# Patient Record
Sex: Female | Born: 1985 | Race: Black or African American | Hispanic: No | Marital: Married | Smoking: Current every day smoker
Health system: Southern US, Community
[De-identification: ages and names within clinical notes are randomized; demographics above are authoritative.]

---

## 2021-10-13 ENCOUNTER — Other Ambulatory Visit: Payer: Self-pay

## 2021-10-13 ENCOUNTER — Encounter: Payer: Self-pay | Admitting: *Deleted

## 2021-10-13 DIAGNOSIS — H532 Diplopia: Secondary | ICD-10-CM | POA: Insufficient documentation

## 2021-10-13 DIAGNOSIS — R519 Headache, unspecified: Secondary | ICD-10-CM | POA: Diagnosis not present

## 2021-10-13 DIAGNOSIS — F17210 Nicotine dependence, cigarettes, uncomplicated: Secondary | ICD-10-CM | POA: Insufficient documentation

## 2021-10-13 DIAGNOSIS — Z20822 Contact with and (suspected) exposure to covid-19: Secondary | ICD-10-CM | POA: Insufficient documentation

## 2021-10-13 NOTE — ED Triage Notes (Signed)
Pt brought in via ems from home with a headache.  Reports pain behind right eye.  Sx for 1 week. No relief with otc meds  pt alert  speech clear.

## 2021-10-14 ENCOUNTER — Emergency Department
Admission: EM | Admit: 2021-10-14 | Discharge: 2021-10-14 | Disposition: A | Discharge status: Home / Self Care | Payer: BC Managed Care – PPO | Attending: Emergency Medicine | Admitting: Emergency Medicine

## 2021-10-14 ENCOUNTER — Emergency Department: Payer: BC Managed Care – PPO

## 2021-10-14 DIAGNOSIS — R519 Headache, unspecified: Secondary | ICD-10-CM

## 2021-10-14 LAB — RESP PANEL BY RT-PCR (FLU A&B, COVID) ARPGX2
Influenza A by PCR: NEGATIVE
Influenza B by PCR: NEGATIVE
SARS Coronavirus 2 by RT PCR: NEGATIVE

## 2021-10-14 MED ORDER — METOCLOPRAMIDE HCL 5 MG/ML IJ SOLN
10.0000 mg | Freq: Once | INTRAMUSCULAR | Status: AC
  Administered 2021-10-14: 01:00:00 10 mg via INTRAVENOUS
  Filled 2021-10-14: qty 2

## 2021-10-14 MED ORDER — IBUPROFEN 400 MG PO TABS: 400.0000 mg | ORAL_TABLET | Freq: Four times a day (QID) | ORAL | 0 refills | Status: AC | PRN

## 2021-10-14 MED ORDER — KETOROLAC TROMETHAMINE 15 MG/ML IJ SOLN
15.0000 mg | Freq: Once | INTRAMUSCULAR | Status: AC
  Administered 2021-10-14: 01:00:00 15 mg via INTRAVENOUS
  Filled 2021-10-14: qty 1

## 2021-10-14 MED ORDER — SODIUM CHLORIDE 0.9 % IV BOLUS
1000.0000 mL | Freq: Once | INTRAVENOUS | Status: AC
  Administered 2021-10-14: 01:00:00 1000 mL via INTRAVENOUS

## 2021-10-14 MED ORDER — METOCLOPRAMIDE HCL 10 MG PO TABS: 10.0000 mg | ORAL_TABLET | Freq: Three times a day (TID) | ORAL | 0 refills | Status: AC

## 2021-10-14 NOTE — ED Notes (Signed)
Pt in lobby waiting for husband to pick her up.

## 2021-10-14 NOTE — ED Provider Notes (Signed)
[facility]  ____________________________________________   Event Date/Time   First MD Initiated Contact with Patient 10/14/21 0006     (approximate)  I have reviewed the triage vital signs and the nursing notes.   HISTORY  Chief Complaint Headache    HPI Glenda Lindsey is a 35 y.o. female with no significant past medical history who presents with headache.  Patient notes that the headaches have been going on for about 1 week.  Pain is on the right side of her face described as a throbbing sensation.  Has been intermittent, usually worse when she lies down at night.  There is no associated nausea vomiting.  Does have photophobia.  She notes that when it first started she felt like her eyes were watering and then today had an episode of double vision and left eye watering.  Also feels like she is having difficulty focusing her eyes.  She denies associated neck pain.  Has not had fevers or chills.  Has had some body aches and COVID exposure.  She denies history of similar headaches or migraine history.  Denies numbness or weakness or difficulty speaking.  Not on any blood thinners.  Tonight while she was lying on the couch she had an episode where she felt like she was not able to control her body.  She was awake and her eyes were open but felt like she could not move her upper or lower extremities.  This is what prompted her to call EMS.         No past medical history on file.  There are no problems to display for this patient.    Prior to Admission medications   Medication Sig Start Date End Date Taking? Authorizing Provider  ibuprofen (ADVIL) 400 MG tablet Take 1 tablet (400 mg total) by mouth every 6 (six) hours as needed. 10/14/21  Yes Rada Hay, MD  metoCLOPramide (REGLAN) 10 MG tablet Take 1 tablet (10 mg total) by mouth 3 (three) times daily with meals for 10 days. 10/14/21 10/24/21 Yes Rada Hay, MD    Allergies Patient has no  known allergies.  No family history on file.  Social History Social History   Tobacco Use   Smoking status: Every Day    Types: Cigarettes  Substance Use Topics   Alcohol use: Not Currently   Drug use: Not Currently    Review of Systems   Review of Systems  Constitutional:  Negative for chills and fever.  HENT:  Negative for congestion.   Eyes:  Positive for photophobia and visual disturbance.  Cardiovascular:  Negative for chest pain.  Gastrointestinal:  Negative for nausea and vomiting.  Neurological:  Positive for headaches. Negative for weakness and numbness.  All other systems reviewed and are negative.  Physical Exam Updated Vital Signs BP (!) 142/89    Pulse 87    Temp 99 F (37.2 C) (Oral)    Resp 19    Ht 5' (1.524 m)    Wt 97.5 kg    LMP 10/06/2021 (Approximate)    SpO2 100%    BMI 41.99 kg/m   Physical Exam Vitals and nursing note reviewed.  Constitutional:      General: She is not in acute distress.    Appearance: Normal appearance.     Comments: Patient prefers to keep her eyes closed during the exam  HENT:     Head: Normocephalic and atraumatic.  Eyes:     General: No scleral icterus.  Conjunctiva/sclera: Conjunctivae normal.  Pulmonary:     Effort: Pulmonary effort is normal. No respiratory distress.     Breath sounds: No stridor.  Musculoskeletal:        General: No deformity or signs of injury.     Cervical back: Normal range of motion.  Skin:    General: Skin is dry.     Coloration: Skin is not jaundiced or pale.  Neurological:     General: No focal deficit present.     Mental Status: She is alert and oriented to person, place, and time. Mental status is at baseline.     Comments: Aox3, nml speech  PERRL, EOMI, face symmetric, nml tongue movement  5/5 strength in the BL upper and lower extremities  Sensation grossly intact in the BL upper and lower extremities  Finger-nose-finger intact BL   Psychiatric:        Mood and Affect: Mood  normal.        Behavior: Behavior normal.     LABS (all labs ordered are listed, but only abnormal results are displayed)  Labs Reviewed  RESP PANEL BY RT-PCR (FLU A&B, COVID) [id number]  POC URINE PREG, ED   ____________________________________________  EKG  N/a ____________________________________________  RADIOLOGY Ky Barban, personally viewed and evaluated these images (plain radiographs) as part of my medical decision making, as well as reviewing the written report by the radiologist.  ED MD interpretation:  I reviewed the CT scan of the brain which does not show any acute intracranial process      ____________________________________________   PROCEDURES  Procedure(s) performed (including Critical Care):  Procedures   ____________________________________________   INITIAL IMPRESSION / ASSESSMENT AND PLAN / ED COURSE     The patient is a 35 year old female presents with 1 week of headache.  It is unilateral behind the right eye with some associated diplopia that is intermittent.  No history of similar headaches.  She has no other neurologic symptoms no infectious symptoms other than some body aches.  On exam she appears uncomfortable and prefers to keep her eyes closed due to photophobia.  Her neurologic exam however is intact, normal extraocular movements no dysmetria and full strength.  She has no meningismus.  Exam seems most consistent with migraine headache.  Given this is her first time with headache and has been going on for a week we will get a CT head to rule out any obvious intracranial pathology.  Will treat supportively with fluids Toradol and Reglan.  We will also make sure she is not pregnant.  I have low suspicion for subarachnoid hemorrhage given the timeframe.  Also considered cerebral venous sinus thrombosis however she has no objective findings on neurologic exam.    CT of the head does not show any acute abnormality.  Patient feeling  improved after Toradol and Reglan.  We discussed neurology follow-up and return to emergency department if headache is worsening or she develops any new neurologic symptoms.  Discharged with Motrin and Reglan. Clinical Course as of 10/14/21 0222  Wed Oct 14, 2021  0058  IMPRESSION: Normal head CT.    [KM]    Clinical Course User Index [KM] Georga Hacking, MD     ____________________________________________   FINAL CLINICAL IMPRESSION(S) / ED DIAGNOSES  Final diagnoses:  Nonintractable headache, unspecified chronicity pattern, unspecified headache type     ED Discharge Orders          Ordered    ibuprofen (ADVIL) 400 MG tablet  Every 6 hours PRN        10/14/21 0221    metoCLOPramide (REGLAN) 10 MG tablet  3 times daily with meals        10/14/21 0221             Note:  This document was prepared using Dragon voice recognition software and may include unintentional dictation errors.    Rada Hay, MD 10/14/21 [phone]

## 2021-10-14 NOTE — Discharge Instructions (Signed)
Your headache may be a migraine.  You can take ibuprofen and Reglan to help with your discomfort.  If you develop any new neurologic symptoms including numbness or weakness or your headache is worsening, please return to the emergency department.  Please follow-up with a neurologist and your primary care provider.

## 2022-10-24 ENCOUNTER — Emergency Department: Payer: BC Managed Care – PPO

## 2022-10-24 ENCOUNTER — Emergency Department
Admission: EM | Admit: 2022-10-24 | Discharge: 2022-10-24 | Discharge status: Against Medical Advice | Payer: BC Managed Care – PPO | Attending: Emergency Medicine | Admitting: Emergency Medicine

## 2022-10-24 ENCOUNTER — Ambulatory Visit: Admission: RE | Admit: 2022-10-24 | Payer: BC Managed Care – PPO | Source: Ambulatory Visit

## 2022-10-24 ENCOUNTER — Other Ambulatory Visit: Payer: Self-pay

## 2022-10-24 DIAGNOSIS — R109 Unspecified abdominal pain: Secondary | ICD-10-CM | POA: Diagnosis not present

## 2022-10-24 DIAGNOSIS — Z5321 Procedure and treatment not carried out due to patient leaving prior to being seen by health care provider: Secondary | ICD-10-CM | POA: Insufficient documentation

## 2022-10-24 DIAGNOSIS — R319 Hematuria, unspecified: Secondary | ICD-10-CM | POA: Diagnosis present

## 2022-10-24 NOTE — ED Triage Notes (Signed)
EMS Reports:  Pt coming from home with blood in urine and left flank pain. Pt complaining 20/10 pain. of fentanyl 4mg  zofran given by EMS  20G Left AC. VSS for EMS

## 2022-10-24 NOTE — ED Triage Notes (Signed)
Pt states she feels like she has a kidney stone- pt states pain started this AM around 6, but thinks she has been peeing blood for the past week

## 2023-09-20 IMAGING — CT CT HEAD W/O CM
4 series · 16 of 47 positions shown, 18 images · non-contrast
Comparison: None.

CLINICAL DATA: Chronic headache

EXAM:
CT HEAD WITHOUT CONTRAST
TECHNIQUE: Contiguous axial images were obtained from the base of the skull
through the vertex without intravenous contrast.

[Series 2: head wo · axial · 0.41mm/px · z∈[+290,+390]mm · 7 of 28 slices shown, 9 images]
[im 4/28  brain]
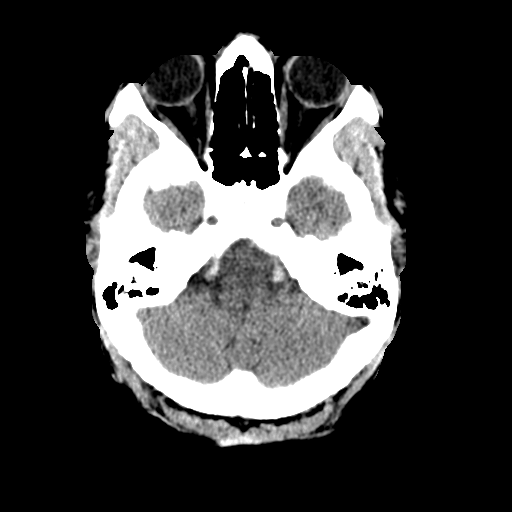
[im 4/28  bone]
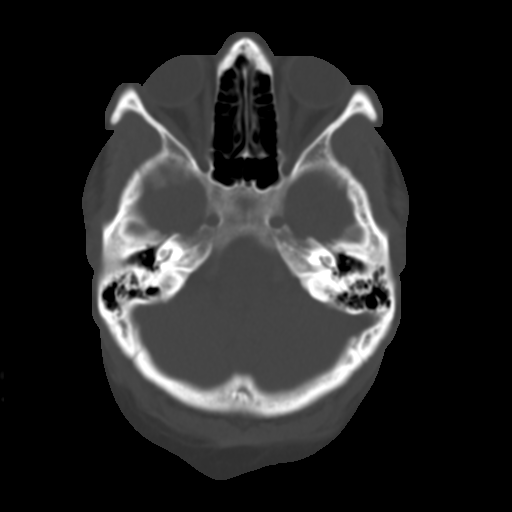
[im 7/28  brain]
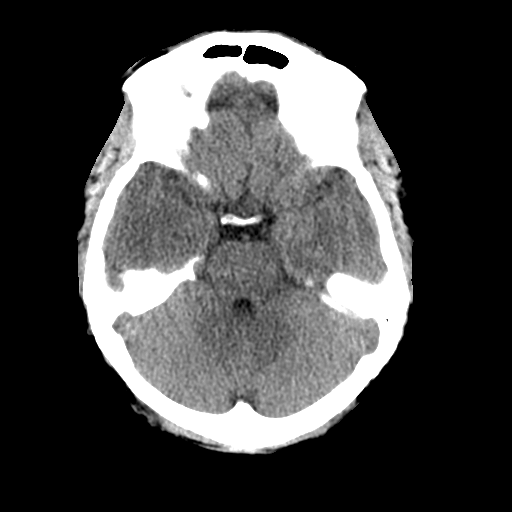
[im 11/28  brain]
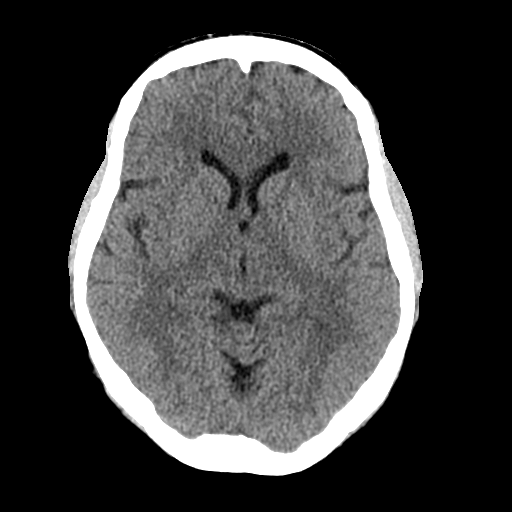
[im 14/28  brain]
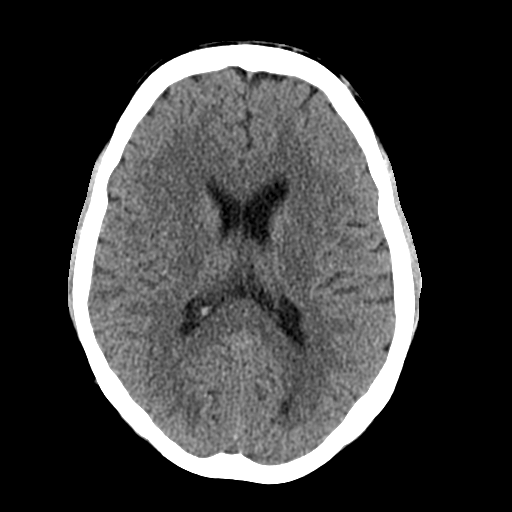
[im 17/28  brain]
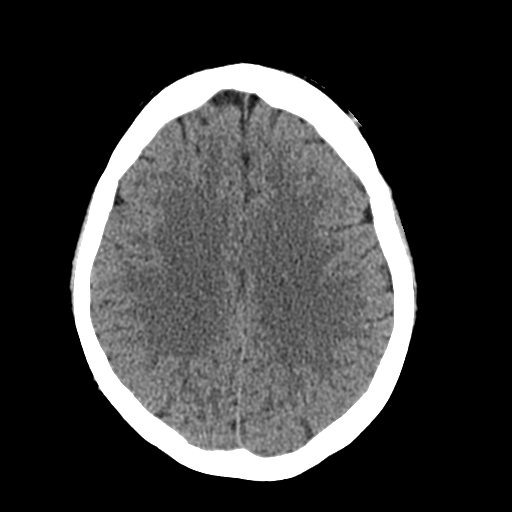
[im 17/28  bone]
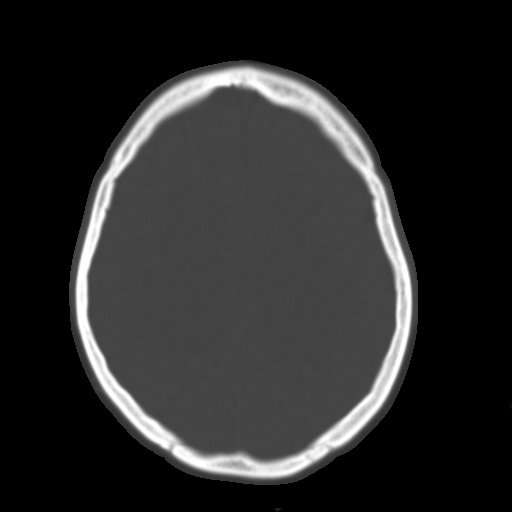
[im 21/28  brain]
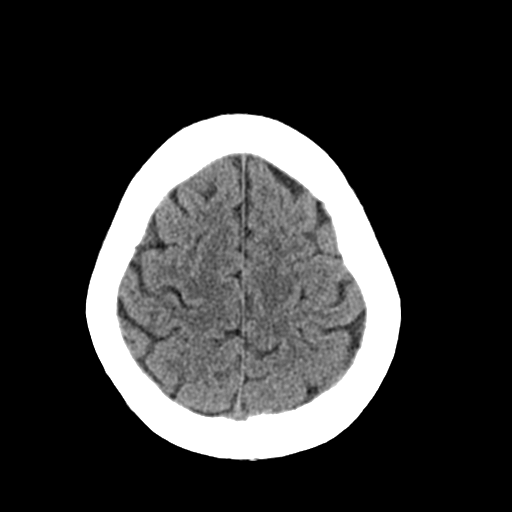
[im 24/28  brain]
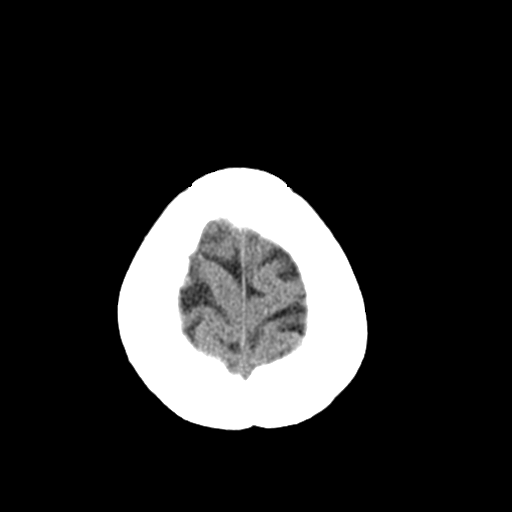

[Series 3: head bone · axial · 0.41mm/px · z∈[+287,+315]mm · 3 of 70 slices shown]
[im 7/70  bone]
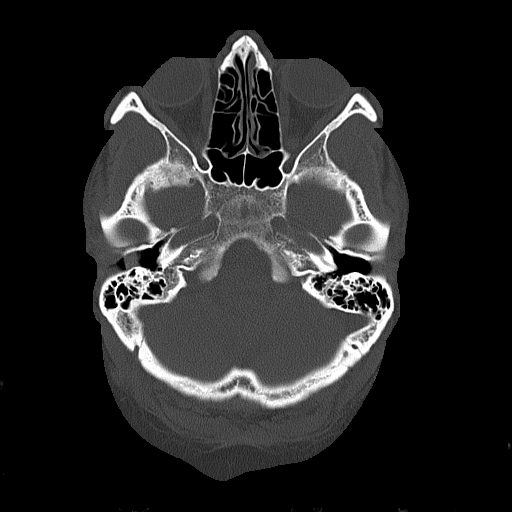
[im 14/70  bone]
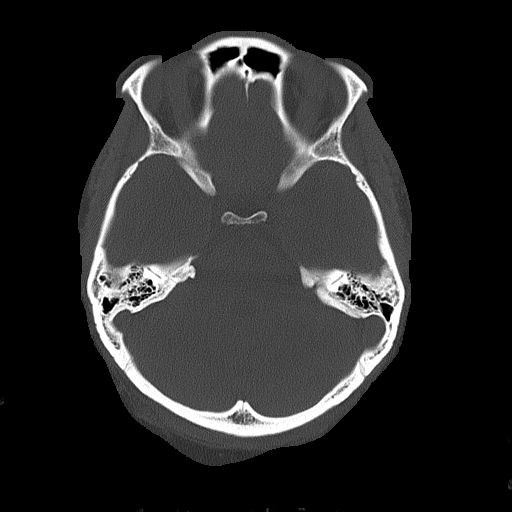
[im 21/70  bone]
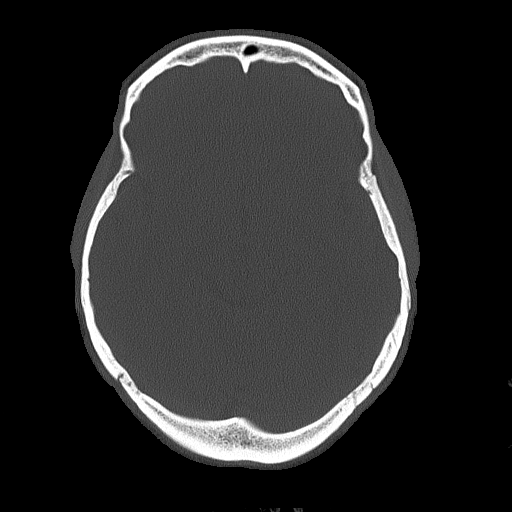

[Series 4: coronal soft tissue · coronal · 0.27mm/px · 3 of 64 slices shown]
[im 22/64  brain]
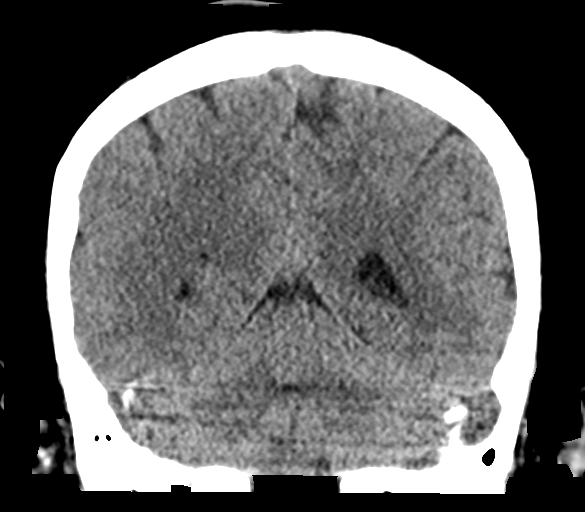
[im 29/64  brain]
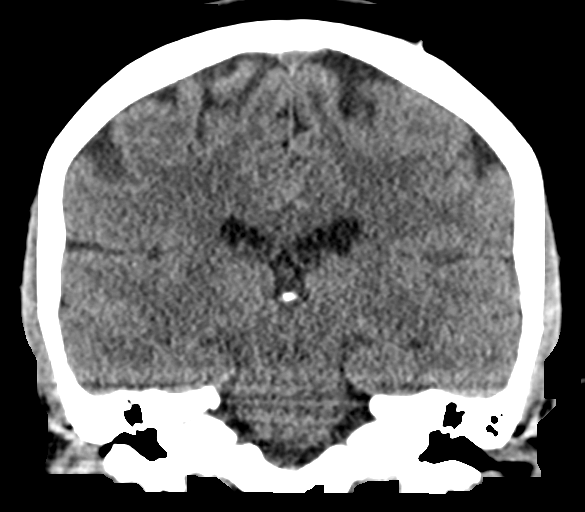
[im 36/64  brain]
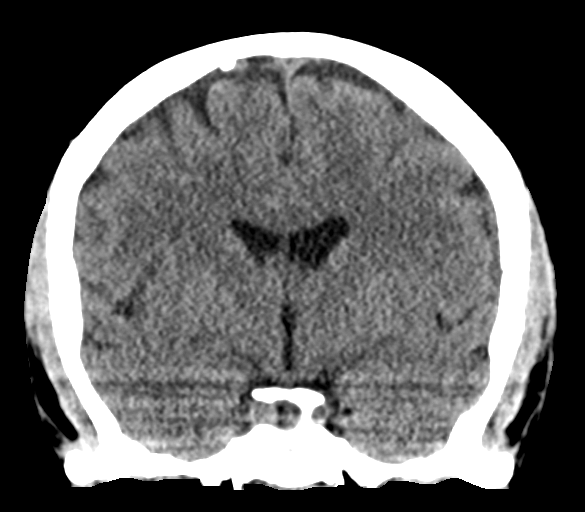

[Series 5: sagittal soft tissue · sagittal · 0.29mm/px · 3 of 52 slices shown]
[im 18/52  brain]
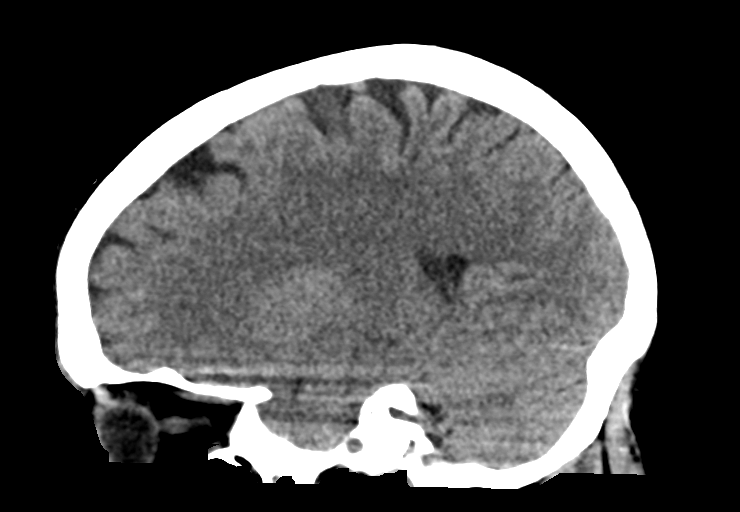
[im 26/52  brain]
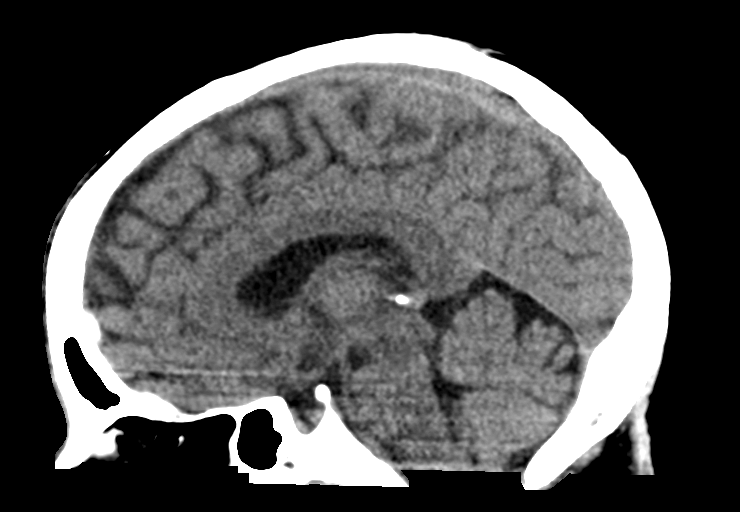
[im 35/52  brain]
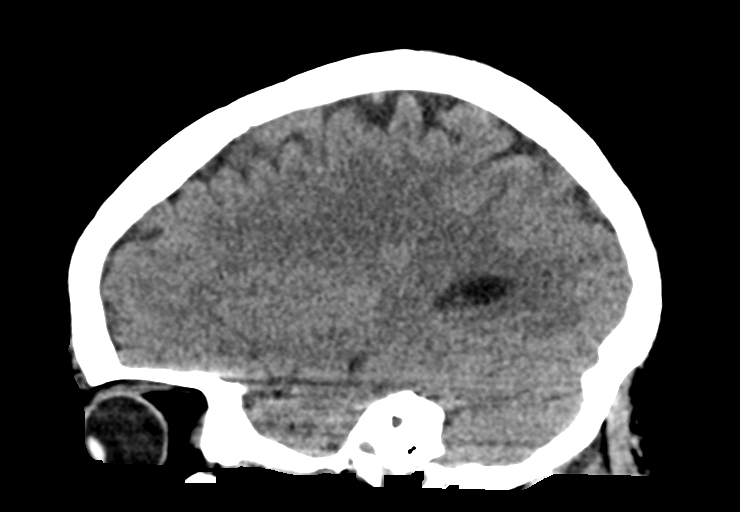

[16 of 47 positions shown; findings below may reference images not displayed]

FINDINGS: Brain: No evidence of acute infarction, hemorrhage, hydrocephalus,
extra-axial collection or mass lesion/mass effect.

Vascular: No hyperdense vessel or unexpected calcification.

Skull: Normal. Negative for fracture or focal lesion.

Sinuses/Orbits: The visualized paranasal sinuses are essentially
clear. The mastoid air cells are unopacified.

Other: None.
IMPRESSION: Normal head CT.
